# Patient Record
Sex: Male | Born: 1985 | Hispanic: No | Marital: Married | Smoking: Never smoker
Health system: Southern US, Community
[De-identification: ages and names within clinical notes are randomized; demographics above are authoritative.]

## PROBLEM LIST (undated history)

## (undated) HISTORY — PX: APPENDECTOMY: SHX54

---

## 2019-08-10 ENCOUNTER — Encounter (HOSPITAL_BASED_OUTPATIENT_CLINIC_OR_DEPARTMENT_OTHER): Payer: Self-pay

## 2019-08-10 ENCOUNTER — Emergency Department (HOSPITAL_BASED_OUTPATIENT_CLINIC_OR_DEPARTMENT_OTHER)
Admission: EM | Admit: 2019-08-10 | Discharge: 2019-08-10 | Disposition: A | Discharge status: Home / Self Care | Payer: Managed Care, Other (non HMO) | Attending: Emergency Medicine | Admitting: Emergency Medicine

## 2019-08-10 ENCOUNTER — Emergency Department (HOSPITAL_BASED_OUTPATIENT_CLINIC_OR_DEPARTMENT_OTHER): Payer: Managed Care, Other (non HMO)

## 2019-08-10 ENCOUNTER — Other Ambulatory Visit: Payer: Self-pay

## 2019-08-10 DIAGNOSIS — R0789 Other chest pain: Secondary | ICD-10-CM | POA: Insufficient documentation

## 2019-08-10 DIAGNOSIS — R0602 Shortness of breath: Secondary | ICD-10-CM | POA: Diagnosis not present

## 2019-08-10 DIAGNOSIS — R079 Chest pain, unspecified: Secondary | ICD-10-CM

## 2019-08-10 LAB — CBC
HCT: 46.6 % (ref 39.0–52.0)
Hemoglobin: 16.1 g/dL (ref 13.0–17.0)
MCH: 28.5 pg (ref 26.0–34.0)
MCHC: 34.5 g/dL (ref 30.0–36.0)
MCV: 82.5 fL (ref 80.0–100.0)
Platelets: 294 10*3/uL (ref 150–400)
RBC: 5.65 MIL/uL (ref 4.22–5.81)
RDW: 11.9 % (ref 11.5–15.5)
WBC: 7.9 10*3/uL (ref 4.0–10.5)
nRBC: 0 % (ref 0.0–0.2)

## 2019-08-10 LAB — BASIC METABOLIC PANEL
Anion gap: 7 (ref 5–15)
BUN: 7 mg/dL (ref 6–20)
CO2: 27 mmol/L (ref 22–32)
Calcium: 9.2 mg/dL (ref 8.9–10.3)
Chloride: 102 mmol/L (ref 98–111)
Creatinine, Ser: 0.87 mg/dL (ref 0.61–1.24)
GFR calc Af Amer: >60 mL/min (ref >60)
GFR calc non Af Amer: >60 mL/min (ref >60)
Glucose, Bld: 116 mg/dL — ABNORMAL HIGH (ref 70–99)
Potassium: 3.6 mmol/L (ref 3.5–5.1)
Sodium: 136 mmol/L (ref 135–145)

## 2019-08-10 LAB — TROPONIN I (HIGH SENSITIVITY)
Troponin I (High Sensitivity): <2 ng/L (ref <18)
Troponin I (High Sensitivity): <2 ng/L (ref <18)

## 2019-08-10 MED ORDER — KETOROLAC TROMETHAMINE 15 MG/ML IJ SOLN
15.0000 mg | Freq: Once | INTRAMUSCULAR | Status: AC
  Administered 2019-08-10: 15 mg via INTRAVENOUS
  Filled 2019-08-10: qty 1

## 2019-08-10 MED ORDER — SODIUM CHLORIDE 0.9% FLUSH
3.0000 mL | Freq: Once | INTRAVENOUS | Status: DC
  Filled 2019-08-10: qty 3

## 2019-08-10 NOTE — Discharge Instructions (Signed)
If you develop recurrent, continued, or worsening chest pain, shortness of breath, fever, vomiting, abdominal or back pain, or any other new/concerning symptoms then return to the ER for evaluation.  

## 2019-08-10 NOTE — ED Triage Notes (Addendum)
Pt c/o CP x 2 hours-grimacing-denies cough/fever

## 2019-08-10 NOTE — ED Provider Notes (Signed)
[facility] EMERGENCY DEPARTMENT Provider Note   CSN: [id number] Arrival date & time: 08/10/19  1618     History Chief Complaint  Patient presents with  . Chest Pain    Warren Hernandez is a 34 y.o. male.  HPI 34 year old male presents with acute chest pain.  Started around 2:30 PM.  It is left-sided in his axilla but sometimes left anterior chest.  Does not specifically radiate.  Hard to describe the pain.  At times it is an 8 or all the way up to a 10.  However at times it also seems to go away.  Started off insidious and then waxes and wanes.  Sometimes deep breathing makes it worse but otherwise nothing specifically makes it worse.  He denies any significant past medical history, smoking history, or family history of CAD.  No recent travel or leg swelling.  He has not take anything for the pain.  At the moment I am talking to him the pain is pretty mild.   History reviewed. No pertinent past medical history.  There are no problems to display for this patient.   Past Surgical History:  Procedure Laterality Date  . APPENDECTOMY         No family history on file.  Social History   Tobacco Use  . Smoking status: Never Smoker  . Smokeless tobacco: Never Used  Substance Use Topics  . Alcohol use: Never  . Drug use: Never    Home Medications Prior to Admission medications   Not on File    Allergies    Patient has no known allergies.  Review of Systems   Review of Systems  Constitutional: Negative for fever.  Respiratory: Positive for shortness of breath. Negative for cough.   Cardiovascular: Positive for chest pain. Negative for leg swelling.  All other systems reviewed and are negative.   Physical Exam Updated Vital Signs BP 118/83   Pulse 73   Temp 98.3 F (36.8 C) (Oral)   Resp (!) 21   Ht 6\' 2"  (1.88 m)   Wt 104.3 kg   SpO2 98%   BMI 29.53 kg/m   Physical Exam Vitals and nursing note reviewed.  Constitutional:      General: He is not  in acute distress.    Appearance: He is well-developed. He is not ill-appearing or diaphoretic.  HENT:     Head: Normocephalic and atraumatic.     Right Ear: External ear normal.     Left Ear: External ear normal.     Nose: Nose normal.  Eyes:     General:        Right eye: No discharge.        Left eye: No discharge.  Cardiovascular:     Rate and Rhythm: Normal rate and regular rhythm.     Heart sounds: Normal heart sounds.  Pulmonary:     Effort: Pulmonary effort is normal.     Breath sounds: Normal breath sounds.  Chest:     Chest wall: No tenderness.  Abdominal:     Palpations: Abdomen is soft.     Tenderness: There is no abdominal tenderness.  Musculoskeletal:     Cervical back: Neck supple.     Right lower leg: No edema.     Left lower leg: No edema.  Skin:    General: Skin is warm and dry.  Neurological:     Mental Status: He is alert.  Psychiatric:        Mood  and Affect: Mood is not anxious.     ED Results / Procedures / Treatments   Labs (all labs ordered are listed, but only abnormal results are displayed) Labs Reviewed  BASIC METABOLIC PANEL - Abnormal; Notable for the following components:      Result Value   Glucose, Bld 116 (*)    All other components within normal limits  CBC  TROPONIN I (HIGH SENSITIVITY)  TROPONIN I (HIGH SENSITIVITY)    EKG EKG Interpretation  Date/Time:  Thursday August 10 2019 16:25:01 EST Ventricular Rate:  83 PR Interval:    QRS Duration: 78 QT Interval:  353 QTC Calculation: 415 R Axis:   77 Text Interpretation: Sinus rhythm no acute ST/T changes No old tracing to compare Confirmed by Sherwood Gambler [phone]) on 08/10/2019 4:36:08 PM   Radiology DG Chest 2 View  Result Date: 08/10/2019 CLINICAL DATA:  Chest pain EXAM: CHEST - 2 VIEW COMPARISON:  None. FINDINGS: The heart size and mediastinal contours are within normal limits. Both lungs are clear. The visualized skeletal structures are unremarkable. IMPRESSION: No  active cardiopulmonary disease. Electronically Signed   By: Inez Catalina M.D.   On: 08/10/2019 18:23    Procedures Procedures (including critical care time)  Medications Ordered in ED Medications  sodium chloride flush (NS) 0.9 % injection 3 mL (3 mLs Intravenous Not Given 08/10/19 1640)  ketorolac (TORADOL) 15 MG/ML injection 15 mg (15 mg Intravenous Given 08/10/19 1652)    ED Course  I have reviewed the triage vital signs and the nursing notes.  Pertinent labs & imaging results that were available during my care of the patient were reviewed by me and considered in my medical decision making (see chart for details).    MDM Rules/Calculators/A&P                      Patient with nonspecific chest pain.  Low risk for ACS.  PERC is 0 in otherwise low-risk patient for PE.  ECG without acute ischemia.  Troponins negative x2.  Pain is resolved after Toradol.  Recommend NSAIDs and follow-up with the PCP.  Return precautions. Final Clinical Impression(s) / ED Diagnoses Final diagnoses:  Nonspecific chest pain    Rx / DC Orders ED Discharge Orders    None       Sherwood Gambler, MD 08/10/19 2012

## 2020-01-25 ENCOUNTER — Ambulatory Visit (INDEPENDENT_AMBULATORY_CARE_PROVIDER_SITE_OTHER): Payer: Managed Care, Other (non HMO) | Admitting: Internal Medicine

## 2020-01-25 ENCOUNTER — Other Ambulatory Visit: Payer: Self-pay

## 2020-01-25 ENCOUNTER — Encounter: Payer: Self-pay | Admitting: Internal Medicine

## 2020-01-25 VITALS — BP 131/83 | HR 103 | Temp 98.2°F | Ht 74.0 in | Wt 231.0 lb

## 2020-01-25 DIAGNOSIS — H538 Other visual disturbances: Secondary | ICD-10-CM

## 2020-01-25 DIAGNOSIS — M545 Low back pain, unspecified: Secondary | ICD-10-CM

## 2020-01-25 DIAGNOSIS — H539 Unspecified visual disturbance: Secondary | ICD-10-CM | POA: Diagnosis not present

## 2020-01-25 DIAGNOSIS — G43009 Migraine without aura, not intractable, without status migrainosus: Secondary | ICD-10-CM | POA: Insufficient documentation

## 2020-01-25 DIAGNOSIS — G43109 Migraine with aura, not intractable, without status migrainosus: Secondary | ICD-10-CM

## 2020-01-25 DIAGNOSIS — H5500 Unspecified nystagmus: Secondary | ICD-10-CM

## 2020-01-25 DIAGNOSIS — R42 Dizziness and giddiness: Secondary | ICD-10-CM

## 2020-01-25 DIAGNOSIS — R208 Other disturbances of skin sensation: Secondary | ICD-10-CM | POA: Diagnosis not present

## 2020-01-25 DIAGNOSIS — K5900 Constipation, unspecified: Secondary | ICD-10-CM

## 2020-01-25 LAB — GLUCOSE, CAPILLARY: Glucose-Capillary: 97 mg/dL (ref 70–99)

## 2020-01-25 LAB — POCT GLYCOSYLATED HEMOGLOBIN (HGB A1C): Hemoglobin A1C: 5.5 % (ref 4.0–5.6)

## 2020-01-25 MED ORDER — ACETAMINOPHEN 500 MG PO TABS
500.0000 mg | ORAL_TABLET | Freq: Once | ORAL | Status: AC
  Administered 2020-01-25: 500 mg via ORAL

## 2020-01-25 MED ORDER — SUMATRIPTAN SUCCINATE 50 MG PO TABS: ORAL_TABLET | ORAL | 0 refills | Status: AC

## 2020-01-25 NOTE — Progress Notes (Addendum)
CC: Headache;back pain; feet pain  HPI:  [name] is a 34 y.o. with a PMHx as listed below who presents to the clinic for Headache;back pain; feet pain.   Please see the Encounters tab for problem-based Assessment & Plan regarding status of patient's acute and chronic conditions.  No past medical history on file.  Review of Systems: Review of Systems  Constitutional: Negative for chills, fever, malaise/fatigue and weight loss.  HENT: Negative for congestion, ear discharge, ear pain, hearing loss, sinus pain, sore throat and tinnitus.   Eyes: Positive for blurred vision. Negative for photophobia.  Respiratory: Negative for cough, shortness of breath and wheezing.   Cardiovascular: Negative for chest pain, palpitations, orthopnea, claudication and leg swelling.  Gastrointestinal: Positive for constipation. Negative for abdominal pain, diarrhea, nausea and vomiting.  Genitourinary: Negative for dysuria.  Musculoskeletal: Positive for back pain and neck pain. Negative for falls.  Neurological: Positive for dizziness and headaches. Negative for focal weakness, seizures, loss of consciousness and weakness.   Physical Exam:  Vitals:   01/25/20 1423  BP: 131/83  Pulse: (!) 103  Temp: 98.2 F (36.8 C)  TempSrc: Oral  SpO2: 98%  Weight: 231 lb (104.8 kg)  Height: 6\' 2"  (1.88 m)   Physical Exam Vitals and nursing note reviewed.  Constitutional:      General: He is not in acute distress.    Appearance: He is normal weight.  HENT:     Head: Normocephalic and atraumatic.     Mouth/Throat:     Mouth: Mucous membranes are moist.     Pharynx: Oropharynx is clear.  Eyes:     General: Lids are normal. Gaze aligned appropriately. No scleral icterus.       Right eye: No discharge.        Left eye: No discharge.     Extraocular Movements:     Right eye: Nystagmus present. Normal extraocular motion.     Left eye: Nystagmus present. Normal extraocular motion.      Conjunctiva/sclera: Conjunctivae normal.     Pupils: Pupils are equal, round, and reactive to light. Pupils are equal.     Comments: Binocular horizontal nystagmus with rightward gaze and leftward gaze. When patient returns gaze to primary position while focusing on my finger, without any head movement, he continues to have a left beating nystagmus in both eyes.  Low amplitude with a moderate frequency.  Right temporal, upper and lower eye fields with reduced peripheral vision compared to left.  Cardiovascular:     Rate and Rhythm: Normal rate and regular rhythm.     Pulses:          Dorsalis pedis pulses are 2+ on the right side and 2+ on the left side.       Posterior tibial pulses are 2+ on the right side and 2+ on the left side.     Heart sounds: No murmur heard.   Pulmonary:     Effort: Pulmonary effort is normal. No respiratory distress.     Breath sounds: Normal breath sounds. No wheezing, rhonchi or rales.  Abdominal:     General: Bowel sounds are normal. There is no distension.     Palpations: Abdomen is soft.     Tenderness: There is no abdominal tenderness.  Musculoskeletal:     Cervical back: Spasms (Bilateral paraspinal and trapezius with muscle spasm) present. No tenderness or bony tenderness.     Thoracic back: No spasms, tenderness or bony tenderness.  Lumbar back: Bony tenderness present. No swelling or deformity.       Back:     Right foot: Normal capillary refill. No deformity, Charcot foot, laceration, tenderness or bony tenderness.     Left foot: Normal capillary refill. No deformity, Charcot foot, laceration, tenderness or bony tenderness.  Skin:    General: Skin is warm and dry.     Findings: No erythema.  Neurological:     Mental Status: He is alert and oriented to person, place, and time.     Cranial Nerves: No cranial nerve deficit, dysarthria or facial asymmetry.     Sensory: No sensory deficit.     Motor: No weakness.     Gait: Gait normal.    Psychiatric:        Mood and Affect: Mood normal.        Speech: Speech normal.        Behavior: Behavior normal.    Assessment & Plan:   See Encounters Tab for problem based charting.  Patient discussed with Dr. Oswaldo Done

## 2020-01-25 NOTE — Patient Instructions (Addendum)
It was nice seeing you today! Thank you for choosing [facility] for your Primary Care.    Today we talked about:   1. Migraines: I am concerned these headaches may actually be migraines. For treatment, I recommend Sumatriptan, a medication I am sending to your pharmacy. In addition, I recommend Ibuprofen. See below for instructions.  2. Back Pain: For pain control: a. Ibuprofen 400 mg (2 tablets) every 6 hours for 1 week. Then continue the same dose only when needed b. Tylenol 2128700004 mg every 6 hours as needed for pain c. Lidocaine patches are very helpful (recommend the 4%) d. Biofreeze gel  e. Tiger Balm patch    I ordered an MRI of your brain. We will call you to schedule this.   Lets follow up in 1-2 weeks to see how you are doing.

## 2020-01-28 NOTE — Assessment & Plan Note (Signed)
Warren Hernandez describes approximately 3 days of vision changes.  Episode started suddenly with partial or complete blackout of his vision that lasts minutes and comes back slowly.  Usually both eyes are affected, but during our interview today, only his right eye was affected and he noted loss of peripheral vision.  With these episodes, he denies always having a concurrent headache.  He denies any other neurological deficits including focal weakness, lack of coordination.  He denies any previous similar episodes in the past.  In the last 1 day, these episodes have been accompanied by dizziness as well.  He has also had a history of atypical migraine, please see above for more details.  Assessment/plan: On examination, patient has acute loss of his right peripheral vision, with inability to see any fingers and told her directly in front of his right eye with the left one covered.  He also has physiological bilateral nystagmus, that is abnormal and that it continues when patient's gaze is at midline.  Given the progressive nature of these vision changes with the new accompaniment of dizziness, he warrants further evaluation with an MRI brain to rule out any acute lesions.  -MRI brain with and without stat

## 2020-01-28 NOTE — Assessment & Plan Note (Signed)
Warren Hernandez describes a several week to month history of bilateral burning sensation on his plantar aspects.  The pain is initiated by any walking or running, and alleviates with rest.  He denies any known history of diabetes or arterial disease.  He denies any injuries.  He denies any radiation of the pain.  He denies any alleviating factors.  Assessment/plan: Consistent with polyneuropathy, although patient denies any numbness.  Sensation is intact on examination, as is motor function.  A1c was checked and patient is not diabetic.  DP and PT pulses were 2+ and intact bilaterally.   Etiology uncertain at this time. Will re-assess once MRI brain is completed.

## 2020-01-28 NOTE — Assessment & Plan Note (Signed)
Mr. Mollenkopf describes a 23-month history of unilateral headaches that occur approximately every other day and last several hours with only minimal alleviation with Tylenol.  The headaches tend to occur most often on his left side, but he cannot recall if they ever also occur on his right.  He denies any photophobia or sensitivity to hearing prior to headache onset.  Once headache starts, he states that he has to lay down in a quiet room without disturbances or headache is worsened.  Headaches most often occur in the middle of the day but around the time the headache started, he did wake up in the middle the night with a headache.    This has led to quite a decline in his functional status, and affecting both his work and home life.  He has difficulty focusing on his job with these headaches onset.  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  In the last couple days, he has noted dizziness and vision changes that he feels may be associated with these headaches.  The vision changes began approximately 3 days ago with sudden onset blurry vision or loss of vision that tends to affect both eyes or sometimes one eye.  The symptoms last minutes before slow resolution in his vision.  Approximately yesterday, he started having dizziness with these vision changes as well.  Yesterday night, he had an additional episode of waking up in the middle the night due to a headache.  He denies any upper extremity or lower extremity weakness associated with these episodes.  Assessment/plan: Given patient's lack of photophobia, phonophobia or nausea prior to headache onset, this is not consistent with migraine with aura.  However he does describe what is photophobia during headache, by sitting has to lie in a dark room without disturbances.  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   I am concerned with his nighttime awakenings with  headaches, as well as to the progressive nature of his vision changes.  Due to this, patient warrants further evaluation with a MRI brain with and without to evaluate for any brain tumors, lesions.   We will also begin symptomatic treatment with sumatriptan, ibuprofen and Tylenol.  -MRI brain with and without contrast stat -Sumatriptan 50 mg taken at first onset of head pain -Ibuprofen and Tylenol as needed with headache

## 2020-01-28 NOTE — Assessment & Plan Note (Signed)
Patient describes a several month history of lumbar back pain without radiation of his pain down his legs.  It is exacerbated by any motion, he wonders if it was instigated by his history of playing cricket.  He denies any saddle anesthesia, lower extremity weakness, or lower extremity shooting pain.  Assessment/plan: Differential includes lumbar radiculopathy versus muscle strain.  Will treat conservatively.  -Ibuprofen 400 mg 3 times daily for 1 week, then as needed from the normal amount -Tylenol as needed

## 2020-01-29 NOTE — Progress Notes (Signed)
Internal Medicine Clinic Attending  Case discussed with Dr. Huel Cote  At the time of the visit.  We reviewed the resident's history and exam and pertinent patient test results.  I agree with the assessment, diagnosis, and plan of care documented in the resident's note.   New onset severe headaches in a young person. There are red flags including headaches that awaken him from sleep. We recommended MRI brain to rule out space occupying lesion as the cause of these high risk headaches.

## 2020-05-09 ENCOUNTER — Encounter: Payer: Self-pay | Admitting: Student

## 2020-05-09 ENCOUNTER — Other Ambulatory Visit: Payer: Self-pay

## 2020-05-09 ENCOUNTER — Ambulatory Visit (INDEPENDENT_AMBULATORY_CARE_PROVIDER_SITE_OTHER): Payer: Managed Care, Other (non HMO) | Admitting: Student

## 2020-05-09 DIAGNOSIS — G43009 Migraine without aura, not intractable, without status migrainosus: Secondary | ICD-10-CM

## 2020-05-09 MED ORDER — TOPIRAMATE 25 MG PO TABS: 25.0000 mg | ORAL_TABLET | Freq: Every day | ORAL | 2 refills | Status: AC

## 2020-05-09 NOTE — Progress Notes (Signed)
  [facility] Residency Telephone Encounter Continuity Care Appointment  HPI:   This telephone encounter was created for Mr. Ab Leaming on 05/09/2020 for the following purpose/cc migraine headache.   Past Medical History:   No past medical history on file.   ROS:  Review of Systems  Constitutional: Negative for fever.  Eyes: Positive for blurred vision and photophobia. Negative for pain and discharge.  Neurological: Positive for headaches. Negative for weakness.     Assessment / Plan / Recommendations:   Please see A&P under problem oriented charting for assessment of the patient's acute and chronic medical conditions.   As always, pt is advised that if symptoms worsen or new symptoms arise, they should go to an urgent care facility or to to ER for further evaluation.   Consent and Medical Decision Making:   Patient seen with Dr. Heide Spark  This is a telephone encounter between Ronald Pippins and Doran Stabler on 05/09/2020 for migraine headache. The visit was conducted with the patient located at home and Doran Stabler at [facility]. The patient's identity was confirmed using their DOB and current address. The patient has consented to being evaluated through a telephone encounter and understands the associated risks (an examination cannot be done and the patient may need to come in for an appointment) / benefits (allows the patient to remain at home, decreasing exposure to coronavirus). I personally spent 18 minutes on medical discussion.

## 2020-05-09 NOTE — Assessment & Plan Note (Addendum)
Patient reports headache that started 3 days ago.  States that he went to [facility] and had to wait outside for a long time in the cold which he thought contributes to his headache.  The headache started the next day, located on the right side, radiates to his neck, associated with blurry vision and nausea.  Described pain as sharp.  Endorses photophobia which she has to lay down in the dark room.  States that sumatriptan helps ease the pain.  Denies neck stiffness, fever, extremity weakness, eye pain or tearing.  He also had similar headache the next day but with less intensity.  Today patient is headache free.  Patient denies alcohol, smoking or drug use.  He was seen at the clinic in August for similar complaint.  He was prescribed sumatriptan to use for acute flare and was order an MRI to rule out any intracranial lesion.  Patient has not had the MRI done because he did not receive any call to schedule the appointment.  Assessment and plan His unilateral headache with photophobia is consistent with migraine headache.  Continue sumatriptan for acute flare.  We will start topiramate for migraine prophylaxis and weight loss benefits.  We will check on the status of the MRI referral. -Sumatriptan for acute attack -Topiramate 25 mg daily for prophylaxis. Can consider Propanolol if Topiramate is not affordable.  -Brain MRI

## 2020-05-13 NOTE — Progress Notes (Signed)
Internal Medicine Clinic Attending  I evaluated the patient.  I personally confirmed the key portions of the history and exam documented by Dr. Nguyen and I reviewed pertinent patient test results.  The assessment, diagnosis, and plan were formulated together and I agree with the documentation in the resident's note.  

## 2020-05-28 ENCOUNTER — Telehealth: Payer: Self-pay

## 2020-05-28 NOTE — Telephone Encounter (Signed)
Pt called / stated soret hroat x 2 days, positive for loss of taste; stated he had a fever of 99 -100; slight cough w/phlegm. No resp problems. Stated he has been vaccinated. We do not have any telehealth nor in person appts today nor tomorrow. He does not want to go to urgent care. Please advise Thanks

## 2020-05-28 NOTE — Telephone Encounter (Signed)
Called pt - informed of Dr Donnetta Hutching response (covid testing, OTC meds- stated no hx of HTN, and calling 911 or to go to ED for worsening symptoms); pt stated ok and thank-you.

## 2020-05-28 NOTE — Telephone Encounter (Signed)
Return pt's call - no answer; left message on self -identified vm to call the office. 

## 2020-05-28 NOTE — Telephone Encounter (Signed)
Would recommend he get outpatient testing for COVID.    Monitor for worsening symptoms.  Call 911 or go to ED for severe SOB, diarrhea, dehydration, pain > 5/10, nausea, vomiting or other concerning symptoms.   Can get over the counter cough/cold medicine.  If hypertensive would recommend Coricidin brand medications in the interim.   Thank you!  Debe Coder, MD

## 2020-05-28 NOTE — Telephone Encounter (Signed)
Pt called stated that he has a sore throat that has been going on for three days along with a fever ( 99.0) with chills ...... no lost of taste or smell

## 2020-12-03 ENCOUNTER — Encounter: Payer: Self-pay | Admitting: *Deleted

## 2021-09-09 IMAGING — DX DG CHEST 2V
2 series · 2 of 2 positions shown · non-contrast
Comparison: None.

CLINICAL DATA: Chest pain

EXAM:
CHEST - 2 VIEW

[chest pa]
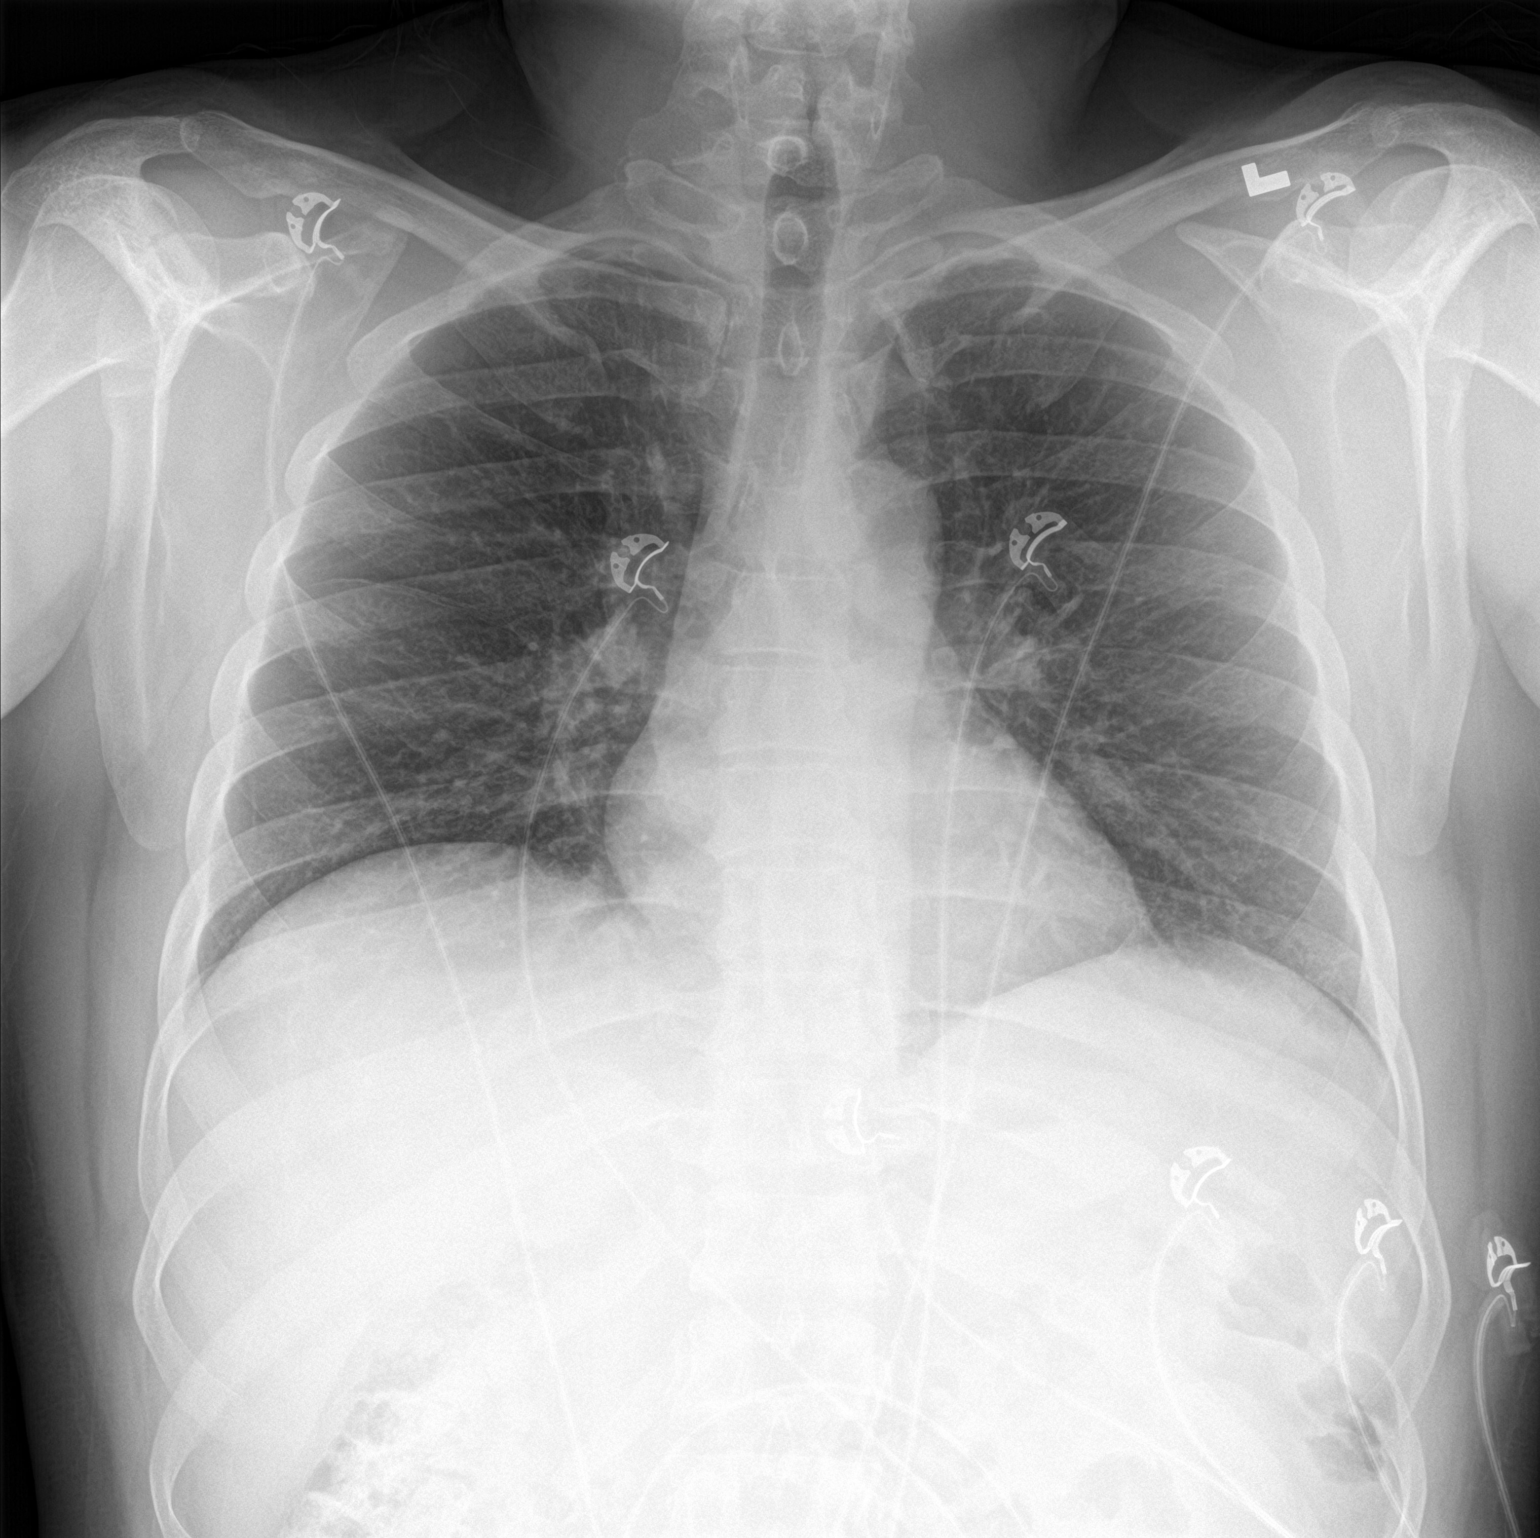

[chest lat]
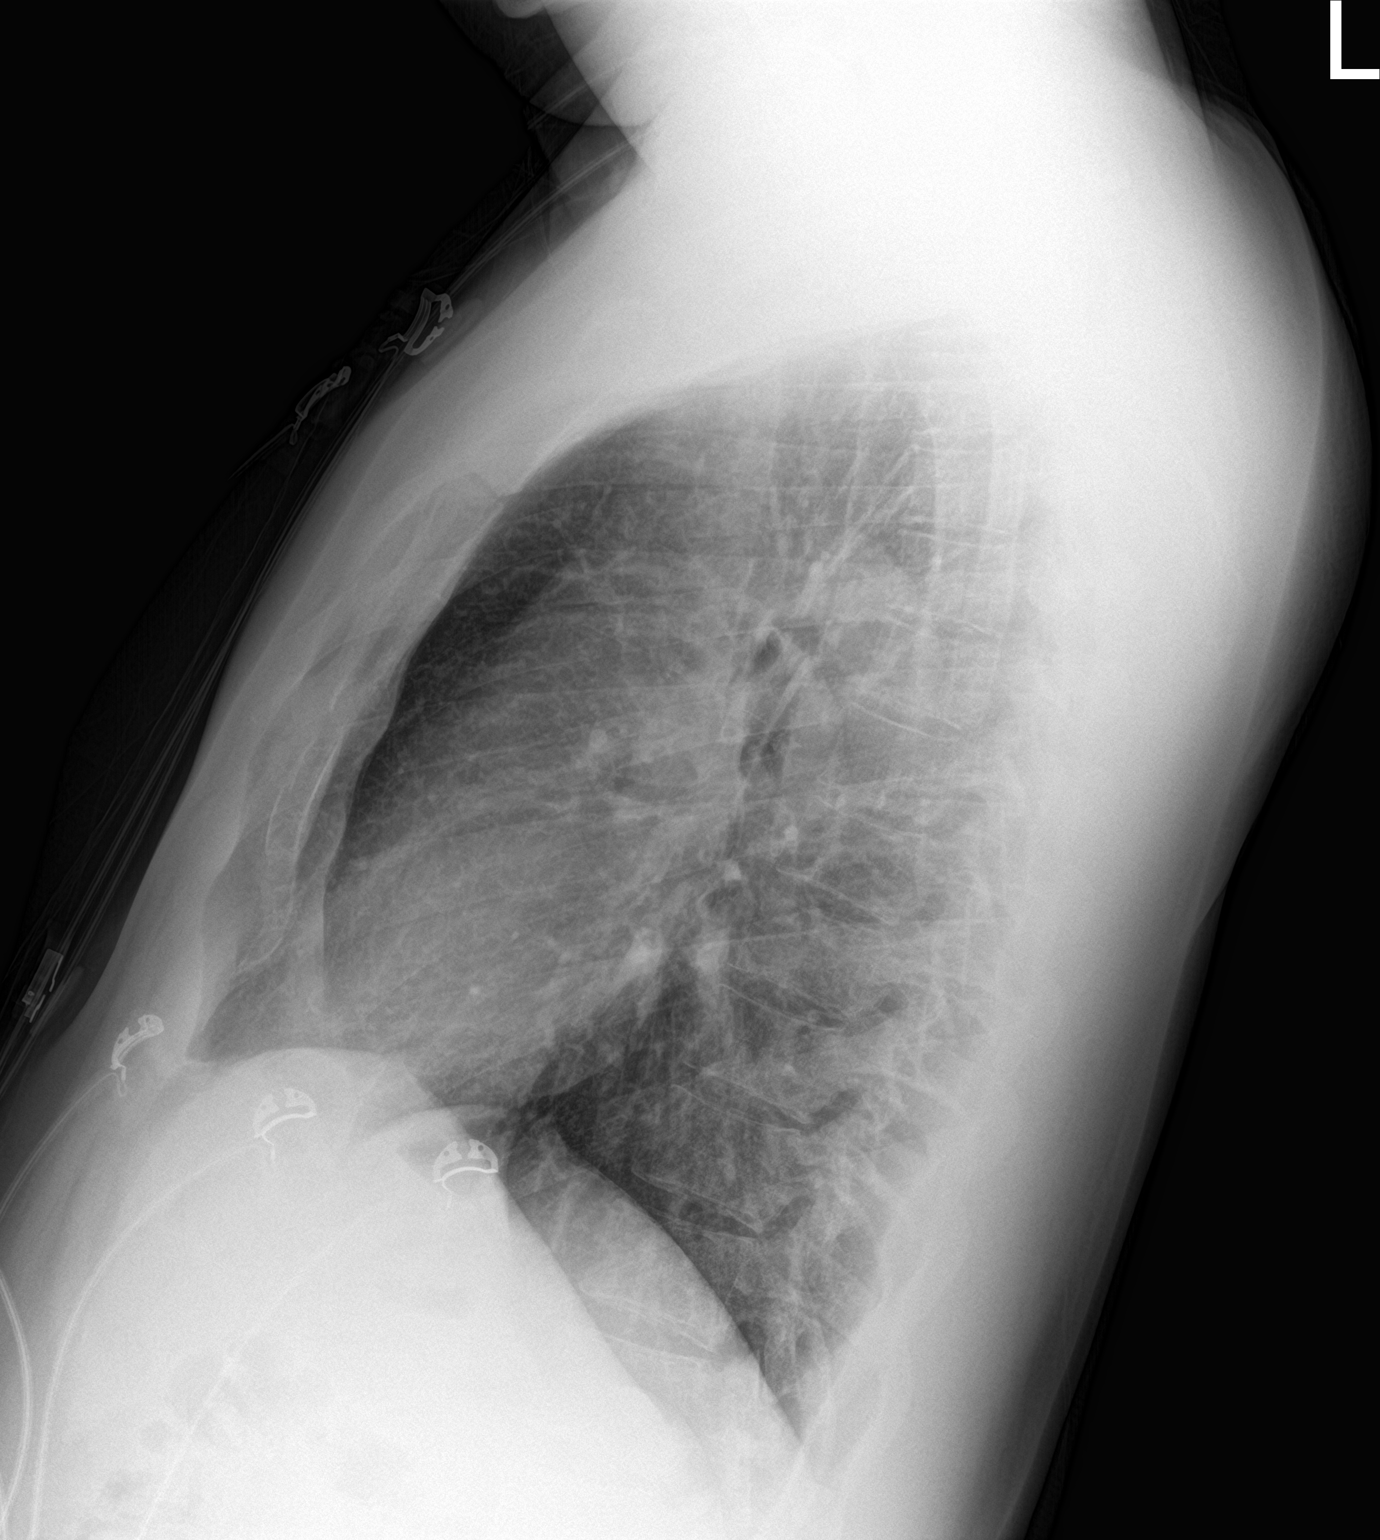

[2 of 2 positions shown; findings below may reference images not displayed]

FINDINGS: The heart size and mediastinal contours are within normal limits.
Both lungs are clear. The visualized skeletal structures are
unremarkable.
IMPRESSION: No active cardiopulmonary disease.
